# Patient Record
Sex: Female | Born: 1965 | Race: White | Hispanic: No | Marital: Married | Smoking: Current some day smoker
Health system: Southern US, Community
[De-identification: ages and names within clinical notes are randomized; demographics above are authoritative.]

## PROBLEM LIST (undated history)

## (undated) DIAGNOSIS — I82409 Acute embolism and thrombosis of unspecified deep veins of unspecified lower extremity: Secondary | ICD-10-CM

## (undated) DIAGNOSIS — O24419 Gestational diabetes mellitus in pregnancy, unspecified control: Secondary | ICD-10-CM

## (undated) DIAGNOSIS — D649 Anemia, unspecified: Secondary | ICD-10-CM

## (undated) DIAGNOSIS — M719 Bursopathy, unspecified: Secondary | ICD-10-CM

## (undated) HISTORY — DX: Gestational diabetes mellitus in pregnancy, unspecified control: O24.419

## (undated) HISTORY — DX: Bursopathy, unspecified: M71.9

---

## 1999-12-28 ENCOUNTER — Emergency Department (HOSPITAL_COMMUNITY): Admission: EM | Admit: 1999-12-28 | Discharge: 1999-12-28 | Payer: Self-pay | Admitting: Emergency Medicine

## 1999-12-28 ENCOUNTER — Encounter: Payer: Self-pay | Admitting: Emergency Medicine

## 2005-04-30 HISTORY — PX: GASTRIC BYPASS: SHX52

## 2007-08-31 HISTORY — PX: CHOLECYSTECTOMY: SHX55

## 2011-08-31 DIAGNOSIS — I82409 Acute embolism and thrombosis of unspecified deep veins of unspecified lower extremity: Secondary | ICD-10-CM

## 2011-08-31 HISTORY — DX: Acute embolism and thrombosis of unspecified deep veins of unspecified lower extremity: I82.409

## 2012-08-30 HISTORY — PX: BREAST BIOPSY: SHX20

## 2012-10-06 ENCOUNTER — Ambulatory Visit: Payer: Self-pay | Admitting: Unknown Physician Specialty

## 2013-08-30 HISTORY — PX: COLPOSCOPY: SHX161

## 2013-09-17 ENCOUNTER — Ambulatory Visit: Payer: Self-pay | Admitting: General Surgery

## 2013-10-11 ENCOUNTER — Encounter: Payer: Self-pay | Admitting: *Deleted

## 2014-07-16 LAB — HM PAP SMEAR: HM PAP: NEGATIVE

## 2016-11-18 ENCOUNTER — Encounter (HOSPITAL_COMMUNITY): Payer: Self-pay

## 2016-11-18 ENCOUNTER — Emergency Department (HOSPITAL_COMMUNITY): Payer: BLUE CROSS/BLUE SHIELD

## 2016-11-18 ENCOUNTER — Emergency Department (HOSPITAL_COMMUNITY)
Admission: EM | Admit: 2016-11-18 | Discharge: 2016-11-19 | Disposition: A | Discharge status: Home / Self Care | Payer: BLUE CROSS/BLUE SHIELD | Attending: Emergency Medicine | Admitting: Emergency Medicine

## 2016-11-18 DIAGNOSIS — R29810 Facial weakness: Secondary | ICD-10-CM | POA: Diagnosis not present

## 2016-11-18 DIAGNOSIS — R0789 Other chest pain: Secondary | ICD-10-CM

## 2016-11-18 DIAGNOSIS — R42 Dizziness and giddiness: Secondary | ICD-10-CM | POA: Diagnosis not present

## 2016-11-18 DIAGNOSIS — F17210 Nicotine dependence, cigarettes, uncomplicated: Secondary | ICD-10-CM | POA: Insufficient documentation

## 2016-11-18 DIAGNOSIS — Z79899 Other long term (current) drug therapy: Secondary | ICD-10-CM | POA: Diagnosis not present

## 2016-11-18 HISTORY — DX: Anemia, unspecified: D64.9

## 2016-11-18 HISTORY — DX: Acute embolism and thrombosis of unspecified deep veins of unspecified lower extremity: I82.409

## 2016-11-18 LAB — BASIC METABOLIC PANEL
ANION GAP: 8 (ref 5–15)
BUN: 10 mg/dL (ref 6–20)
CHLORIDE: 105 mmol/L (ref 101–111)
CO2: 26 mmol/L (ref 22–32)
Calcium: 8.9 mg/dL (ref 8.9–10.3)
Creatinine, Ser: 0.63 mg/dL (ref 0.44–1.00)
GFR calc Af Amer: >60 mL/min (ref >60)
Glucose, Bld: 124 mg/dL — ABNORMAL HIGH (ref 65–99)
POTASSIUM: 3.9 mmol/L (ref 3.5–5.1)
Sodium: 139 mmol/L (ref 135–145)

## 2016-11-18 LAB — HEPATIC FUNCTION PANEL
ALK PHOS: 52 U/L (ref 38–126)
ALT: 13 U/L — AB (ref 14–54)
AST: 20 U/L (ref 15–41)
Albumin: 3.6 g/dL (ref 3.5–5.0)
Bilirubin, Direct: <0.1 mg/dL — ABNORMAL LOW (ref 0.1–0.5)
TOTAL PROTEIN: 6.6 g/dL (ref 6.5–8.1)
Total Bilirubin: 0.5 mg/dL (ref 0.3–1.2)

## 2016-11-18 LAB — CBC
HEMATOCRIT: 38.9 % (ref 36.0–46.0)
HEMOGLOBIN: 12.3 g/dL (ref 12.0–15.0)
MCH: 26.7 pg (ref 26.0–34.0)
MCHC: 31.6 g/dL (ref 30.0–36.0)
MCV: 84.4 fL (ref 78.0–100.0)
Platelets: 296 10*3/uL (ref 150–400)
RBC: 4.61 MIL/uL (ref 3.87–5.11)
RDW: 15.3 % (ref 11.5–15.5)
WBC: 8.1 10*3/uL (ref 4.0–10.5)

## 2016-11-18 LAB — LIPASE, BLOOD: Lipase: 21 U/L (ref 11–51)

## 2016-11-18 LAB — I-STAT TROPONIN, ED
Troponin i, poc: 0 ng/mL (ref 0.00–0.08)
Troponin i, poc: 0 ng/mL (ref 0.00–0.08)

## 2016-11-18 MED ORDER — METOCLOPRAMIDE HCL 5 MG/ML IJ SOLN
10.0000 mg | Freq: Once | INTRAMUSCULAR | Status: AC
  Administered 2016-11-18: 10 mg via INTRAVENOUS
  Filled 2016-11-18: qty 2

## 2016-11-18 MED ORDER — GADOBENATE DIMEGLUMINE 529 MG/ML IV SOLN
20.0000 mL | Freq: Once | INTRAVENOUS | Status: AC
  Administered 2016-11-18: 18 mL via INTRAVENOUS

## 2016-11-18 MED ORDER — IOPAMIDOL (ISOVUE-370) INJECTION 76%
INTRAVENOUS | Status: AC
  Administered 2016-11-18: 100 mL
  Filled 2016-11-18: qty 100

## 2016-11-18 MED ORDER — KETOROLAC TROMETHAMINE 15 MG/ML IJ SOLN
15.0000 mg | Freq: Once | INTRAMUSCULAR | Status: AC
  Administered 2016-11-18: 15 mg via INTRAVENOUS
  Filled 2016-11-18: qty 1

## 2016-11-18 MED ORDER — LORAZEPAM 2 MG/ML IJ SOLN
1.0000 mg | Freq: Once | INTRAMUSCULAR | Status: AC
  Administered 2016-11-18: 1 mg via INTRAVENOUS
  Filled 2016-11-18: qty 1

## 2016-11-18 MED ORDER — HYDROCODONE-ACETAMINOPHEN 5-325 MG PO TABS: 2.0000 | ORAL_TABLET | Freq: Once | ORAL | Status: DC

## 2016-11-18 MED ORDER — MORPHINE SULFATE (PF) 4 MG/ML IV SOLN
4.0000 mg | Freq: Once | INTRAVENOUS | Status: AC
  Administered 2016-11-18: 4 mg via INTRAVENOUS
  Filled 2016-11-18: qty 1

## 2016-11-18 MED ORDER — DIPHENHYDRAMINE HCL 50 MG/ML IJ SOLN
25.0000 mg | Freq: Once | INTRAMUSCULAR | Status: AC
  Administered 2016-11-18: 25 mg via INTRAVENOUS
  Filled 2016-11-18: qty 1

## 2016-11-18 NOTE — ED Provider Notes (Signed)
[facility]-EMERGENCY DEPT Provider Note   CSN: [id number] Arrival date & time: 11/18/16  1444     History   Chief Complaint Chief Complaint  Patient presents with  . Chest Pain    HPI Kristy Zuniga is a 51 y.o. female.  HPI   51 yo F with h/o anemia, DVT here with general fatigue, subjective shortness of breath, and chest pain. Pt states that she had a mild cold several weeks ago with associated cough and mild shortness of breath. She recovered from this and was doing well until the last two days. Over the Last 2 days, she has developed a sharp, pleuritic right-sided chest pain that radiates to her shoulder. She has had no abdominal pain, nausea, or vomiting and is status post cholecystectomy. She also endorses generalized, severe fatigue along with subjective left facial droop, difficulty walking, and nausea with dizziness and spinning sensation. Her family tried to get her to come to the doctor and were finally able to convince her this afternoon, as her symptoms were not improving. She currently endorses ongoing chest pain as well as dizziness and fatigue. She has no history of stroke. She does have a history of DVT in the past but denies any leg swelling and this was reportedly a provoked DVT due to immobility.  Past Medical History:  Diagnosis Date  . Anemia   . DVT (deep venous thrombosis) (HCC)     There are no active problems to display for this patient.   Past Surgical History:  Procedure Laterality Date  . GASTRIC BYPASS      OB History    No data available       Home Medications    Prior to Admission medications   Medication Sig Start Date End Date Taking? Authorizing Provider  DiphenhydrAMINE HCl, Sleep, (SLEEP AID) 25 MG CAPS Take 1 capsule by mouth at bedtime.    Yes Historical Provider, MD  meclizine (ANTIVERT) 25 MG tablet Take 1 tablet (25 mg total) by mouth every 6 (six) hours as needed for dizziness. 11/19/16   Pricilla Loveless, MD    Family History No  family history on file.  Social History Social History  Substance Use Topics  . Smoking status: Current Some Day Smoker    Types: Cigarettes  . Smokeless tobacco: Never Used  . Alcohol use Yes     Allergies   Patient has no known allergies.   Review of Systems Review of Systems  Constitutional: Positive for fatigue. Negative for chills and fever.  HENT: Negative for congestion and rhinorrhea.   Eyes: Negative for visual disturbance.  Respiratory: Positive for shortness of breath. Negative for cough and wheezing.   Cardiovascular: Negative for chest pain and leg swelling.  Gastrointestinal: Negative for abdominal pain, diarrhea, nausea and vomiting.  Genitourinary: Negative for dysuria and flank pain.  Musculoskeletal: Negative for neck pain and neck stiffness.  Skin: Negative for rash and wound.  Allergic/Immunologic: Negative for immunocompromised state.  Neurological: Positive for dizziness, weakness and light-headedness. Negative for headaches.  All other systems reviewed and are negative.    Physical Exam Updated Vital Signs BP 106/67   Pulse 65   Temp 98.5 F (36.9 C) (Oral)   Resp 16   Ht 5\' 8"  (1.727 m)   Wt 180 lb (81.6 kg)   LMP 10/14/2016   SpO2 100%   BMI 27.37 kg/m   Physical Exam  Constitutional: She is oriented to person, place, and time. She appears well-developed and well-nourished. No  distress.  HENT:  Head: Normocephalic and atraumatic.  Eyes: Conjunctivae are normal.  Neck: Neck supple.  Cardiovascular: Normal rate, regular rhythm and normal heart sounds.  Exam reveals no friction rub.   No murmur heard. Pulmonary/Chest: Effort normal and breath sounds normal. No respiratory distress. She has no wheezes. She has no rales. She exhibits tenderness (Moderate right parasternal and intercostal tenderness).  Abdominal: She exhibits no distension.  Musculoskeletal: She exhibits no edema.  Neurological: She is alert and oriented to person, place,  and time. She exhibits normal muscle tone.  Skin: Skin is warm. Capillary refill takes less than 2 seconds.  Psychiatric: She has a normal mood and affect.  Nursing note and vitals reviewed.   Neurological Exam:  Mental Status: Alert and oriented to person, place, and time. Attention and concentration normal. Speech clear. Recent memory is intact. Cranial Nerves: Visual fields grossly intact. EOMI and PERRLA. No nystagmus noted. Facial sensation intact at forehead, maxillary cheek, and chin/mandible bilaterally. There is subtle left NLF flattening and facial weakness.Marland Kitchen. Hearing grossly normal. Uvula is midline, and palate elevates symmetrically. Normal SCM and trapezius strength. Tongue midline without fasciculations. Motor: Muscle strength 5/5 in proximal and distal UE and LE bilaterally. No pronator drift. Muscle tone normal. Reflexes: 2+ and symmetrical in all four extremities.  Sensation: Intact to light touch in upper and lower extremities distally bilaterally.  Gait: Broad-based, slightly ataxic. Coordination: Past pointing on left FTN, normal on right. Mild dysmetria with HTS on left.   ED Treatments / Results  Labs (all labs ordered are listed, but only abnormal results are displayed) Labs Reviewed  BASIC METABOLIC PANEL - Abnormal; Notable for the following:       Result Value   Glucose, Bld 124 (*)    All other components within normal limits  HEPATIC FUNCTION PANEL - Abnormal; Notable for the following:    ALT 13 (*)    Bilirubin, Direct <0.1 (*)    All other components within normal limits  CBC  LIPASE, BLOOD  I-STAT TROPOININ, ED  I-STAT TROPOININ, ED    EKG  EKG Interpretation  Date/Time:  Thursday November 18 2016 14:51:20 EDT Ventricular Rate:  58 PR Interval:  152 QRS Duration: 92 QT Interval:  456 QTC Calculation: 447 R Axis:   -3 Text Interpretation:  Sinus bradycardia Low voltage QRS Borderline ECG Significant artifact No apparent ST elevations within  limitations of study Confirmed by Delayni Streed MD, Jantz Main [phone]([id number]) on 11/19/2016 1:29:57 PM       Radiology Dg Chest 2 View  Result Date: 11/18/2016 CLINICAL DATA:  Chest pain and shortness of breath EXAM: CHEST  2 VIEW COMPARISON:  None. FINDINGS: The heart size and mediastinal contours are within normal limits. Both lungs are clear. The visualized skeletal structures are unremarkable. Surgical clips in the left upper quadrant. IMPRESSION: No active cardiopulmonary disease. Electronically Signed   By: Jasmine PangKim  Fujinaga M.D.   On: 11/18/2016 15:19   Ct Head Wo Contrast  Result Date: 11/18/2016 CLINICAL DATA:  Facial droop EXAM: CT HEAD WITHOUT CONTRAST TECHNIQUE: Contiguous axial images were obtained from the base of the skull through the vertex without intravenous contrast. COMPARISON:  None. FINDINGS: Brain: The ventricles are normal in size and configuration. There is no intracranial mass, hemorrhage, extra-axial fluid collection, or midline shift. Gray-white compartments are normal. No acute infarct evident. Vascular: No hyperdense vessel. There is slight calcification in each carotid siphon. Skull:  Bony calvarium appears intact. Sinuses/Orbits: Visualized paranasal sinuses are clear. Orbits  appear symmetric bilaterally. Other: Mastoid air cells are clear. IMPRESSION: Slight vascular calcification. No intracranial mass, hemorrhage, or extra-axial fluid collection. Gray-white compartments appear normal. Electronically Signed   By: Bretta Bang III M.D.   On: 11/18/2016 18:46   Ct Angio Chest Pe W And/or Wo Contrast  Result Date: 11/18/2016 CLINICAL DATA:  Acute onset of shortness of breath and centralized chest pain and back pain. Initial encounter. EXAM: CT ANGIOGRAPHY CHEST WITH CONTRAST TECHNIQUE: Multidetector CT imaging of the chest was performed using the standard protocol during bolus administration of intravenous contrast. Multiplanar CT image reconstructions and MIPs were obtained to evaluate  the vascular anatomy. CONTRAST:  100 mL of Isovue 370 IV contrast COMPARISON:  Chest radiograph performed earlier today at 3:05 p.m. FINDINGS: Cardiovascular:  There is no evidence of pulmonary embolus. The heart is borderline normal in size. The thoracic aorta is unremarkable. No calcific atherosclerotic disease is seen. The great vessels are within normal limits. Mediastinum/Nodes: The mediastinum is unremarkable in appearance. No mediastinal lymphadenopathy is seen. No pericardial effusion is identified. The great vessels are grossly unremarkable. Lungs/Pleura: Minimal bilateral atelectasis is noted. The lungs are otherwise clear. No pleural effusion or pneumothorax is seen. No masses are identified. Upper Abdomen: The visualized portions of the liver and spleen are grossly unremarkable. Musculoskeletal: No acute osseous abnormalities are identified. The visualized musculature is unremarkable in appearance. Review of the MIP images confirms the above findings. IMPRESSION: 1. No evidence of pulmonary embolus. 2. Minimal bilateral atelectasis noted.  Lungs otherwise clear. Electronically Signed   By: Roanna Raider M.D.   On: 11/18/2016 18:45   Mr Laqueta Jean And Wo Contrast  Result Date: 11/18/2016 CLINICAL DATA:  Initial evaluation for left facial droop. EXAM: MRI HEAD WITHOUT AND WITH CONTRAST MRI CERVICAL SPINE WITHOUT AND WITH CONTRAST TECHNIQUE: Multiplanar, multiecho pulse sequences of the brain and surrounding structures, and cervical spine, to include the craniocervical junction and cervicothoracic junction, were obtained without and with intravenous contrast. CONTRAST:  18mL MULTIHANCE GADOBENATE DIMEGLUMINE 529 MG/ML IV SOLN COMPARISON:  Comparison with prior CT from earlier the same day. FINDINGS: MRI HEAD FINDINGS Brain: Study somewhat degraded by motion artifact. Cerebral volume within normal limits. No focal parenchymal signal abnormality identified. No significant cerebral white matter disease. No  abnormal foci of restricted diffusion to suggest acute or subacute ischemia. Gray-white matter differentiation maintained. No evidence for chronic infarction. No acute or chronic intracranial hemorrhage. No mass lesion, midline shift or mass effect. Ventricles normal in size without evidence for hydrocephalus. No extra-axial fluid collection. Major dural sinuses are grossly patent. No abnormal enhancement. Pituitary gland within normal limits. Suprasellar region unremarkable. Incidental note made of a small approximately 1 cm pineal cyst, of doubtful significance. Vascular: Major intracranial vascular flow voids maintained. Skull and upper cervical spine: Craniocervical junction within normal limits. Bone marrow signal intensity normal. No scalp soft tissue abnormality. Sinuses/Orbits: Globes and orbital soft tissues within normal limits. Paranasal sinuses are largely clear. No mastoid effusion. Inner ear structures normal. Other: None. MRI CERVICAL SPINE FINDINGS Alignment: Study degraded by motion artifact. Trace anterolisthesis of C3 on C4. Vertebral bodies otherwise normally aligned with preservation of the normal cervical lordosis. Vertebrae: Vertebral body heights maintained. No evidence for acute or chronic fracture. Signal intensity within the vertebral body bone marrow within normal limits. No worrisome osseous lesions. No abnormal marrow edema. No abnormal enhancement. Cord: Signal intensity within the cervical spinal cord is normal. No abnormal enhancement. Posterior Fossa, vertebral arteries, paraspinal tissues: Craniocervical  junction within normal limits. Paraspinous and prevertebral soft tissues within normal limits. Normal intravascular flow voids present within the vertebral arteries bilaterally. No abnormal enhancement. Disc levels: C2-C3: Left central disc osteophyte complex indents the left ventral thecal sac (series 15, image 5). This potentially could affect the left C3 nerve root. No  significant stenosis. Foramina widely patent. C3-C4: Mild diffuse degenerative disc osteophyte. Posterior disc osteophyte mildly flattens the ventral thecal sac without significant stenosis. No foraminal encroachment. C4-C5:  Mild degenerative disc bulge.  No stenosis. C5-C6: Mild degenerative disc bulge with uncovertebral spurring. No stenosis. C6-C7:  Unremarkable. C7-T1:  Unremarkable. Visualized upper thoracic spine unremarkable. IMPRESSION: MRI HEAD IMPRESSION: Normal MRI of the brain.  No acute intracranial process identified. MRI CERVICAL SPINE  IMPRESSION: 1. No acute abnormality within the cervical spine. Normal appearance of the cervical spinal cord. 2. Left posterior disc osteophyte at C2-3, potentially affecting the left C3 nerve root. 3. Additional mild degenerative spondylolysis at C3-4 through C5-6 as above without significant stenosis. Electronically Signed   By: Rise Mu M.D.   On: 11/18/2016 22:19   Mr Cervical Spine W Or Wo Contrast  Result Date: 11/18/2016 CLINICAL DATA:  Initial evaluation for left facial droop. EXAM: MRI HEAD WITHOUT AND WITH CONTRAST MRI CERVICAL SPINE WITHOUT AND WITH CONTRAST TECHNIQUE: Multiplanar, multiecho pulse sequences of the brain and surrounding structures, and cervical spine, to include the craniocervical junction and cervicothoracic junction, were obtained without and with intravenous contrast. CONTRAST:  18mL MULTIHANCE GADOBENATE DIMEGLUMINE 529 MG/ML IV SOLN COMPARISON:  Comparison with prior CT from earlier the same day. FINDINGS: MRI HEAD FINDINGS Brain: Study somewhat degraded by motion artifact. Cerebral volume within normal limits. No focal parenchymal signal abnormality identified. No significant cerebral white matter disease. No abnormal foci of restricted diffusion to suggest acute or subacute ischemia. Gray-white matter differentiation maintained. No evidence for chronic infarction. No acute or chronic intracranial hemorrhage. No mass  lesion, midline shift or mass effect. Ventricles normal in size without evidence for hydrocephalus. No extra-axial fluid collection. Major dural sinuses are grossly patent. No abnormal enhancement. Pituitary gland within normal limits. Suprasellar region unremarkable. Incidental note made of a small approximately 1 cm pineal cyst, of doubtful significance. Vascular: Major intracranial vascular flow voids maintained. Skull and upper cervical spine: Craniocervical junction within normal limits. Bone marrow signal intensity normal. No scalp soft tissue abnormality. Sinuses/Orbits: Globes and orbital soft tissues within normal limits. Paranasal sinuses are largely clear. No mastoid effusion. Inner ear structures normal. Other: None. MRI CERVICAL SPINE FINDINGS Alignment: Study degraded by motion artifact. Trace anterolisthesis of C3 on C4. Vertebral bodies otherwise normally aligned with preservation of the normal cervical lordosis. Vertebrae: Vertebral body heights maintained. No evidence for acute or chronic fracture. Signal intensity within the vertebral body bone marrow within normal limits. No worrisome osseous lesions. No abnormal marrow edema. No abnormal enhancement. Cord: Signal intensity within the cervical spinal cord is normal. No abnormal enhancement. Posterior Fossa, vertebral arteries, paraspinal tissues: Craniocervical junction within normal limits. Paraspinous and prevertebral soft tissues within normal limits. Normal intravascular flow voids present within the vertebral arteries bilaterally. No abnormal enhancement. Disc levels: C2-C3: Left central disc osteophyte complex indents the left ventral thecal sac (series 15, image 5). This potentially could affect the left C3 nerve root. No significant stenosis. Foramina widely patent. C3-C4: Mild diffuse degenerative disc osteophyte. Posterior disc osteophyte mildly flattens the ventral thecal sac without significant stenosis. No foraminal encroachment.  C4-C5:  Mild degenerative disc bulge.  No  stenosis. C5-C6: Mild degenerative disc bulge with uncovertebral spurring. No stenosis. C6-C7:  Unremarkable. C7-T1:  Unremarkable. Visualized upper thoracic spine unremarkable. IMPRESSION: MRI HEAD IMPRESSION: Normal MRI of the brain.  No acute intracranial process identified. MRI CERVICAL SPINE  IMPRESSION: 1. No acute abnormality within the cervical spine. Normal appearance of the cervical spinal cord. 2. Left posterior disc osteophyte at C2-3, potentially affecting the left C3 nerve root. 3. Additional mild degenerative spondylolysis at C3-4 through C5-6 as above without significant stenosis. Electronically Signed   By: Rise Mu M.D.   On: 11/18/2016 22:19    Procedures Procedures (including critical care time)  Medications Ordered in ED Medications  iopamidol (ISOVUE-370) 76 % injection (100 mLs  Contrast Given 11/18/16 1816)  ketorolac (TORADOL) 15 MG/ML injection 15 mg (15 mg Intravenous Given 11/18/16 1925)  morphine 4 MG/ML injection 4 mg (4 mg Intravenous Given 11/18/16 1925)  metoCLOPramide (REGLAN) injection 10 mg (10 mg Intravenous Given 11/18/16 2022)  diphenhydrAMINE (BENADRYL) injection 25 mg (25 mg Intravenous Given 11/18/16 2022)  LORazepam (ATIVAN) injection 1 mg (1 mg Intravenous Given 11/18/16 2027)  gadobenate dimeglumine (MULTIHANCE) injection 20 mL (18 mLs Intravenous Contrast Given 11/18/16 2127)     Initial Impression / Assessment and Plan / ED Course  I have reviewed the triage vital signs and the nursing notes.  Pertinent labs & imaging results that were available during my care of the patient were reviewed by me and considered in my medical decision making (see chart for details).     51 yo F with PMHx as above here with pleuritic right sided CP, also left facial droop (mild), ataxia, and fatigue. Regarding her right sided CP, history and exam is c/w pleurisy. PE is highly concerning given her extensive history and  will obtain CT NGO. Otherwise, EKG is nonischemic and I do not suspect ACS. Regarding her neurological symptoms, will obtain CT scan. Last known normal is unknown. It is possible she may have a postviral cerebellitis or other inflammatory condition. No headache, neck stiffness, leukocytosis, or evidence of meningitis or encephalitis.  CT scan is negative for PE and also troponin is negative. I do not suspect PE or ACS. Will treat as pleurisy with scheduled NSAIDs. Regarding her neurological symptoms. CT head was negative but given persistent symptoms, MRI obtained and shows no evidence of multiple sclerosis or other abnormality. I discussed with Dr. Roseanne Reno of neurology. Given her persistent symptoms, will have him evaluated in the ER with further disposition pending his evaluation.  Patient care transferred to Dr. Criss Alvine at the end of my shift. Patient presentation, ED course, and plan of care discussed with review of all pertinent labs and imaging. Please see his/her note for further details regarding further ED course and disposition.   Final Clinical Impressions(s) / ED Diagnoses   Final diagnoses:  Vertigo  Atypical chest pain     Shaune Pollack, MD 11/19/16 1330

## 2016-11-18 NOTE — ED Triage Notes (Signed)
Pt reports central sharp chest pain that radiates to her right shoulder associated with SOB, nausea and dizziness; onset last week but today she felt worse.

## 2016-11-18 NOTE — ED Notes (Signed)
Pt family came out to speak to this RN after pt was transported to CT and states that pt is drawn up on the left side and has complained to them of L hand numbness. MD aware.

## 2016-11-18 NOTE — ED Notes (Signed)
Pt pistol, holster and clip given to [place] with security who this RN witnessed lock in safe.

## 2016-11-18 NOTE — ED Notes (Signed)
Patient transported to CT 

## 2016-11-18 NOTE — ED Notes (Signed)
ED Provider at bedside. 

## 2016-11-19 DIAGNOSIS — R29810 Facial weakness: Secondary | ICD-10-CM | POA: Diagnosis not present

## 2016-11-19 DIAGNOSIS — R42 Dizziness and giddiness: Secondary | ICD-10-CM | POA: Diagnosis not present

## 2016-11-19 MED ORDER — MECLIZINE HCL 25 MG PO TABS: 25.0000 mg | ORAL_TABLET | Freq: Four times a day (QID) | ORAL | 0 refills | Status: AC | PRN

## 2016-11-19 NOTE — ED Provider Notes (Signed)
12:46 AM Dr. Roseanne RenoStewart has seen and evaluated. Given negative MRI and patient report of dizziness, he feels this is a peripheral vertigo. Given her trouble walking, recommended observation admission to the hospitalist. Recommends Antivert 25 mg every 6 hours as needed. Possible early bell's palsy given slight mouth asymmetry though no droop. Upon discussing this with the patient, she wants to go home. She understands reason for admission is because of her trouble walking and concern for possible fall and subsequent injury. She understands this but also appears to have good family support with multiple members at the bedside who are going to help her at home. She is feeling somewhat better. Her chest pain is atypical with no clear etiology at this time. Plan for close outpatient follow-up with PCP as well as a neurology follow-up. At this time she appears stable for discharge, however I have recommended that she return if any of her symptoms worsen or do not improve. She and family agree.   Pricilla LovelessScott Carnell Casamento, MD 11/19/16 [phone]

## 2016-11-19 NOTE — ED Notes (Signed)
Pt verbalized understanding of discharge instructions.

## 2016-11-19 NOTE — Consult Note (Signed)
Admission H&P    Chief Complaint: Fatigue, chest pain, shortness of breath and vertigo.  HPI: Kristy Zuniga is an 51 y.o. female with a history of DVT and asthma presenting with general fatigue, shortness of breath, chest pain and vertigo. Symptoms started about 2 days ago. Vertigo as pronounced with standing and walking. There is associated nausea as well. CT scan of her head was obtained which was unremarkable. Because of a history of DVT she had a CT of her chest which showed no signs of pulmonary embolus. An MRI of the brain was obtained which showed no acute intracranial abnormality. She was given Zofran for nausea which has helped. Family members have noted right lower facial droop which is new. She has not experienced weakness or numbness involving extremities. Speech has not changed.  Past Medical History:  Diagnosis Date  . Anemia   . DVT (deep venous thrombosis) ([place])     Past Surgical History:  Procedure Laterality Date  . GASTRIC BYPASS      No family history on file. Social History:  reports that she has been smoking Cigarettes.  She has never used smokeless tobacco. She reports that she drinks alcohol. Her drug history is not on file.  Allergies: No Known Allergies  Medications: Preadmission medications were reviewed by me.  ROS: History obtained from the patient  General ROS: negative for - chills, fatigue, fever, night sweats, weight gain or weight loss Psychological ROS: negative for - behavioral disorder, hallucinations, memory difficulties, mood swings or suicidal ideation Ophthalmic ROS: negative for - blurry vision, double vision, eye pain or loss of vision ENT ROS: negative for - epistaxis, nasal discharge, oral lesions, sore throat, tinnitus or vertigo Allergy and Immunology ROS: negative for - hives or itchy/watery eyes Hematological and Lymphatic ROS: negative for - bleeding problems, bruising or swollen lymph nodes Endocrine ROS: negative for -  galactorrhea, hair pattern changes, polydipsia/polyuria or temperature intolerance Respiratory ROS: negative for - cough, hemoptysis, shortness of breath or wheezing Cardiovascular ROS: negative for - chest pain, dyspnea on exertion, edema or irregular heartbeat Gastrointestinal ROS: negative for - abdominal pain, diarrhea, hematemesis, nausea/vomiting or stool incontinence Genito-Urinary ROS: negative for - dysuria, hematuria, incontinence or urinary frequency/urgency Musculoskeletal ROS: negative for - joint swelling or muscular weakness Neurological ROS: as noted in HPI Dermatological ROS: negative for rash and skin lesion changes  Physical Examination: Blood pressure (!) 149/80, pulse (!) 56, temperature 98.5 F (36.9 C), temperature source Oral, resp. rate 16, height '5\' 8"'$  (1.727 m), weight 81.6 kg (180 lb), last menstrual period 10/14/2016, SpO2 99 %.  HEENT-  Normocephalic, no lesions, without obvious abnormality.  Normal external eye and conjunctiva.  Normal TM's bilaterally.  Normal auditory canals and external ears. Normal external nose, mucus membranes and septum.  Normal pharynx. Neck supple with no masses, nodes, nodules or enlargement. Cardiovascular - regular rate and rhythm, S1, S2 normal, no murmur, click, rub or gallop Lungs - chest clear, no wheezing, rales, normal symmetric air entry Abdomen - soft, non-tender; bowel sounds normal; no masses,  no organomegaly Extremities - no joint deformities, effusion, or inflammation  Neurologic Examination: Mental Status: Alert, oriented, thought content appropriate.  Speech fluent without evidence of aphasia. Able to follow commands without difficulty. Cranial Nerves: II-Visual fields were normal. III/IV/VI-Pupils were equal and reacted normally to light. Extraocular movements were full and conjugate.    V/VII-slight reduction and perception of tactile sensation over left side of the face; facial asymmetry noted with mild right  lower facial droop. VIII-normal. X-normal speech and symmetrical palatal movement. XI: trapezius strength/neck flexion strength normal bilaterally Motor: 5/5 bilaterally with normal tone and bulk Sensory: Normal throughout. Deep Tendon Reflexes: 2+ and symmetric. Plantars: Flexor bilaterally Cerebellar: Normal finger-to-nose and heel-to-shin testing bilaterally. Carotid auscultation: Normal  Results for orders placed or performed during the hospital encounter of 11/18/16 (from the past 48 hour(s))  Basic metabolic panel     Status: Abnormal   Collection Time: 11/18/16  2:58 PM  Result Value Ref Range   Sodium 139 135 - 145 mmol/L   Potassium 3.9 3.5 - 5.1 mmol/L   Chloride 105 101 - 111 mmol/L   CO2 26 22 - 32 mmol/L   Glucose, Bld 124 (H) 65 - 99 mg/dL   BUN 10 6 - 20 mg/dL   Creatinine, Ser 0.63 0.44 - 1.00 mg/dL   Calcium 8.9 8.9 - 10.3 mg/dL   GFR calc non Af Amer >60 >60 mL/min   GFR calc Af Amer >60 >60 mL/min    Comment: (NOTE) The eGFR has been calculated using the CKD EPI equation. This calculation has not been validated in all clinical situations. eGFR's persistently <60 mL/min signify possible Chronic Kidney Disease.    Anion gap 8 5 - 15  CBC     Status: None   Collection Time: 11/18/16  2:58 PM  Result Value Ref Range   WBC 8.1 4.0 - 10.5 K/uL   RBC 4.61 3.87 - 5.11 MIL/uL   Hemoglobin 12.3 12.0 - 15.0 g/dL   HCT 38.9 36.0 - 46.0 %   MCV 84.4 78.0 - 100.0 fL   MCH 26.7 26.0 - 34.0 pg   MCHC 31.6 30.0 - 36.0 g/dL   RDW 15.3 11.5 - 15.5 %   Platelets 296 150 - 400 K/uL  Hepatic function panel     Status: Abnormal   Collection Time: 11/18/16  2:58 PM  Result Value Ref Range   Total Protein 6.6 6.5 - 8.1 g/dL   Albumin 3.6 3.5 - 5.0 g/dL   AST 20 15 - 41 U/L   ALT 13 (L) 14 - 54 U/L   Alkaline Phosphatase 52 38 - 126 U/L   Total Bilirubin 0.5 0.3 - 1.2 mg/dL   Bilirubin, Direct <0.1 (L) 0.1 - 0.5 mg/dL   Indirect Bilirubin NOT CALCULATED 0.3 - 0.9 mg/dL   Lipase, blood     Status: None   Collection Time: 11/18/16  2:58 PM  Result Value Ref Range   Lipase 21 11 - 51 U/L  I-stat troponin, ED     Status: None   Collection Time: 11/18/16  3:13 PM  Result Value Ref Range   Troponin i, poc 0.00 0.00 - 0.08 ng/mL   Comment 3            Comment: Due to the release kinetics of cTnI, a negative result within the first hours of the onset of symptoms does not rule out myocardial infarction with certainty. If myocardial infarction is still suspected, repeat the test at appropriate intervals.   I-stat troponin, ED     Status: None   Collection Time: 11/18/16  7:40 PM  Result Value Ref Range   Troponin i, poc 0.00 0.00 - 0.08 ng/mL   Comment 3            Comment: Due to the release kinetics of cTnI, a negative result within the first hours of the onset of symptoms does not rule out myocardial infarction with  certainty. If myocardial infarction is still suspected, repeat the test at appropriate intervals.    Dg Chest 2 View  Result Date: 11/18/2016 CLINICAL DATA:  Chest pain and shortness of breath EXAM: CHEST  2 VIEW COMPARISON:  None. FINDINGS: The heart size and mediastinal contours are within normal limits. Both lungs are clear. The visualized skeletal structures are unremarkable. Surgical clips in the left upper quadrant. IMPRESSION: No active cardiopulmonary disease. Electronically Signed   By: Donavan Foil M.D.   On: 11/18/2016 15:19   Ct Head Wo Contrast  Result Date: 11/18/2016 CLINICAL DATA:  Facial droop EXAM: CT HEAD WITHOUT CONTRAST TECHNIQUE: Contiguous axial images were obtained from the base of the skull through the vertex without intravenous contrast. COMPARISON:  None. FINDINGS: Brain: The ventricles are normal in size and configuration. There is no intracranial mass, hemorrhage, extra-axial fluid collection, or midline shift. Gray-white compartments are normal. No acute infarct evident. Vascular: No hyperdense vessel. There is  slight calcification in each carotid siphon. Skull:  Bony calvarium appears intact. Sinuses/Orbits: Visualized paranasal sinuses are clear. Orbits appear symmetric bilaterally. Other: Mastoid air cells are clear. IMPRESSION: Slight vascular calcification. No intracranial mass, hemorrhage, or extra-axial fluid collection. Gray-white compartments appear normal. Electronically Signed   By: Lowella Grip III M.D.   On: 11/18/2016 18:46   Ct Angio Chest Pe W And/or Wo Contrast  Result Date: 11/18/2016 CLINICAL DATA:  Acute onset of shortness of breath and centralized chest pain and back pain. Initial encounter. EXAM: CT ANGIOGRAPHY CHEST WITH CONTRAST TECHNIQUE: Multidetector CT imaging of the chest was performed using the standard protocol during bolus administration of intravenous contrast. Multiplanar CT image reconstructions and MIPs were obtained to evaluate the vascular anatomy. CONTRAST:  100 mL of Isovue 370 IV contrast COMPARISON:  Chest radiograph performed earlier today at 3:05 p.m. FINDINGS: Cardiovascular:  There is no evidence of pulmonary embolus. The heart is borderline normal in size. The thoracic aorta is unremarkable. No calcific atherosclerotic disease is seen. The great vessels are within normal limits. Mediastinum/Nodes: The mediastinum is unremarkable in appearance. No mediastinal lymphadenopathy is seen. No pericardial effusion is identified. The great vessels are grossly unremarkable. Lungs/Pleura: Minimal bilateral atelectasis is noted. The lungs are otherwise clear. No pleural effusion or pneumothorax is seen. No masses are identified. Upper Abdomen: The visualized portions of the liver and spleen are grossly unremarkable. Musculoskeletal: No acute osseous abnormalities are identified. The visualized musculature is unremarkable in appearance. Review of the MIP images confirms the above findings. IMPRESSION: 1. No evidence of pulmonary embolus. 2. Minimal bilateral atelectasis noted.   Lungs otherwise clear. Electronically Signed   By: Garald Balding M.D.   On: 11/18/2016 18:45   Mr Jeri Cos And Wo Contrast  Result Date: 11/18/2016 CLINICAL DATA:  Initial evaluation for left facial droop. EXAM: MRI HEAD WITHOUT AND WITH CONTRAST MRI CERVICAL SPINE WITHOUT AND WITH CONTRAST TECHNIQUE: Multiplanar, multiecho pulse sequences of the brain and surrounding structures, and cervical spine, to include the craniocervical junction and cervicothoracic junction, were obtained without and with intravenous contrast. CONTRAST:  85m MULTIHANCE GADOBENATE DIMEGLUMINE 529 MG/ML IV SOLN COMPARISON:  Comparison with prior CT from earlier the same day. FINDINGS: MRI HEAD FINDINGS Brain: Study somewhat degraded by motion artifact. Cerebral volume within normal limits. No focal parenchymal signal abnormality identified. No significant cerebral white matter disease. No abnormal foci of restricted diffusion to suggest acute or subacute ischemia. Gray-white matter differentiation maintained. No evidence for chronic infarction. No acute or chronic intracranial  hemorrhage. No mass lesion, midline shift or mass effect. Ventricles normal in size without evidence for hydrocephalus. No extra-axial fluid collection. Major dural sinuses are grossly patent. No abnormal enhancement. Pituitary gland within normal limits. Suprasellar region unremarkable. Incidental note made of a small approximately 1 cm pineal cyst, of doubtful significance. Vascular: Major intracranial vascular flow voids maintained. Skull and upper cervical spine: Craniocervical junction within normal limits. Bone marrow signal intensity normal. No scalp soft tissue abnormality. Sinuses/Orbits: Globes and orbital soft tissues within normal limits. Paranasal sinuses are largely clear. No mastoid effusion. Inner ear structures normal. Other: None. MRI CERVICAL SPINE FINDINGS Alignment: Study degraded by motion artifact. Trace anterolisthesis of C3 on C4. Vertebral  bodies otherwise normally aligned with preservation of the normal cervical lordosis. Vertebrae: Vertebral body heights maintained. No evidence for acute or chronic fracture. Signal intensity within the vertebral body bone marrow within normal limits. No worrisome osseous lesions. No abnormal marrow edema. No abnormal enhancement. Cord: Signal intensity within the cervical spinal cord is normal. No abnormal enhancement. Posterior Fossa, vertebral arteries, paraspinal tissues: Craniocervical junction within normal limits. Paraspinous and prevertebral soft tissues within normal limits. Normal intravascular flow voids present within the vertebral arteries bilaterally. No abnormal enhancement. Disc levels: C2-C3: Left central disc osteophyte complex indents the left ventral thecal sac (series 15, image 5). This potentially could affect the left C3 nerve root. No significant stenosis. Foramina widely patent. C3-C4: Mild diffuse degenerative disc osteophyte. Posterior disc osteophyte mildly flattens the ventral thecal sac without significant stenosis. No foraminal encroachment. C4-C5:  Mild degenerative disc bulge.  No stenosis. C5-C6: Mild degenerative disc bulge with uncovertebral spurring. No stenosis. C6-C7:  Unremarkable. C7-T1:  Unremarkable. Visualized upper thoracic spine unremarkable. IMPRESSION: MRI HEAD IMPRESSION: Normal MRI of the brain.  No acute intracranial process identified. MRI CERVICAL SPINE  IMPRESSION: 1. No acute abnormality within the cervical spine. Normal appearance of the cervical spinal cord. 2. Left posterior disc osteophyte at C2-3, potentially affecting the left C3 nerve root. 3. Additional mild degenerative spondylolysis at C3-4 through C5-6 as above without significant stenosis. Electronically Signed   By: Rise Mu M.D.   On: 11/18/2016 22:19   Mr Cervical Spine W Or Wo Contrast  Result Date: 11/18/2016 CLINICAL DATA:  Initial evaluation for left facial droop. EXAM: MRI HEAD  WITHOUT AND WITH CONTRAST MRI CERVICAL SPINE WITHOUT AND WITH CONTRAST TECHNIQUE: Multiplanar, multiecho pulse sequences of the brain and surrounding structures, and cervical spine, to include the craniocervical junction and cervicothoracic junction, were obtained without and with intravenous contrast. CONTRAST:  57mL MULTIHANCE GADOBENATE DIMEGLUMINE 529 MG/ML IV SOLN COMPARISON:  Comparison with prior CT from earlier the same day. FINDINGS: MRI HEAD FINDINGS Brain: Study somewhat degraded by motion artifact. Cerebral volume within normal limits. No focal parenchymal signal abnormality identified. No significant cerebral white matter disease. No abnormal foci of restricted diffusion to suggest acute or subacute ischemia. Gray-white matter differentiation maintained. No evidence for chronic infarction. No acute or chronic intracranial hemorrhage. No mass lesion, midline shift or mass effect. Ventricles normal in size without evidence for hydrocephalus. No extra-axial fluid collection. Major dural sinuses are grossly patent. No abnormal enhancement. Pituitary gland within normal limits. Suprasellar region unremarkable. Incidental note made of a small approximately 1 cm pineal cyst, of doubtful significance. Vascular: Major intracranial vascular flow voids maintained. Skull and upper cervical spine: Craniocervical junction within normal limits. Bone marrow signal intensity normal. No scalp soft tissue abnormality. Sinuses/Orbits: Globes and orbital soft tissues within normal limits.  Paranasal sinuses are largely clear. No mastoid effusion. Inner ear structures normal. Other: None. MRI CERVICAL SPINE FINDINGS Alignment: Study degraded by motion artifact. Trace anterolisthesis of C3 on C4. Vertebral bodies otherwise normally aligned with preservation of the normal cervical lordosis. Vertebrae: Vertebral body heights maintained. No evidence for acute or chronic fracture. Signal intensity within the vertebral body bone  marrow within normal limits. No worrisome osseous lesions. No abnormal marrow edema. No abnormal enhancement. Cord: Signal intensity within the cervical spinal cord is normal. No abnormal enhancement. Posterior Fossa, vertebral arteries, paraspinal tissues: Craniocervical junction within normal limits. Paraspinous and prevertebral soft tissues within normal limits. Normal intravascular flow voids present within the vertebral arteries bilaterally. No abnormal enhancement. Disc levels: C2-C3: Left central disc osteophyte complex indents the left ventral thecal sac (series 15, image 5). This potentially could affect the left C3 nerve root. No significant stenosis. Foramina widely patent. C3-C4: Mild diffuse degenerative disc osteophyte. Posterior disc osteophyte mildly flattens the ventral thecal sac without significant stenosis. No foraminal encroachment. C4-C5:  Mild degenerative disc bulge.  No stenosis. C5-C6: Mild degenerative disc bulge with uncovertebral spurring. No stenosis. C6-C7:  Unremarkable. C7-T1:  Unremarkable. Visualized upper thoracic spine unremarkable. IMPRESSION: MRI HEAD IMPRESSION: Normal MRI of the brain.  No acute intracranial process identified. MRI CERVICAL SPINE  IMPRESSION: 1. No acute abnormality within the cervical spine. Normal appearance of the cervical spinal cord. 2. Left posterior disc osteophyte at C2-3, potentially affecting the left C3 nerve root. 3. Additional mild degenerative spondylolysis at C3-4 through C5-6 as above without significant stenosis. Electronically Signed   By: Jeannine Boga M.D.   On: 11/18/2016 22:19    Assessment/Plan 51 year old lady presenting with multiple complaints including vertigo, description of which is indicative of probable benign paroxysmal positional vertigo. Examination shows no focal deficits other than mild facial droop on the right. Early right Bell's palsy cannot be ruled out. No sign of an acute intracranial abnormality was seen  on CT scan and MRI clearly no mass lesion nor stroke or demyelinating type lesion.  Recommendations: 1. Trial of meclizine 25 mg every 6 hours when necessary vertigo 2. Diazepam 1 mg every 6 hours if vertigo is not controlled with meclizine 3. Defer intervention with steroids for possible Bell's palsy, as diagnosis is uncertain at this point. Steroid treatment is recommended however if she develops further right facial weakness, or loss of taste on the right side of her tongue. 4. Physical therapy consult for vestibular training  C.R. Nicole Kindred, MD [facility] [phone]  11/19/2016, 12:38 AM

## 2017-02-21 ENCOUNTER — Encounter: Payer: Self-pay | Admitting: Obstetrics and Gynecology

## 2017-02-22 ENCOUNTER — Encounter: Payer: Self-pay | Admitting: Obstetrics and Gynecology

## 2017-02-23 NOTE — Progress Notes (Signed)
This encounter was created in error - please disregard.

## 2018-10-21 IMAGING — CT CT HEAD W/O CM
4 series · 23 of 47 positions shown, 27 images · non-contrast
Comparison: None.

CLINICAL DATA: Facial droop

EXAM:
CT HEAD WITHOUT CONTRAST
TECHNIQUE: Contiguous axial images were obtained from the base of the skull
through the vertex without intravenous contrast.

[Series 201: head w/o, idose (1) · axial · non-contrast · 0.44mm/px · z∈[+986,+1116]mm · 13 of 32 slices shown, 17 images]
[im 3/32  brain]
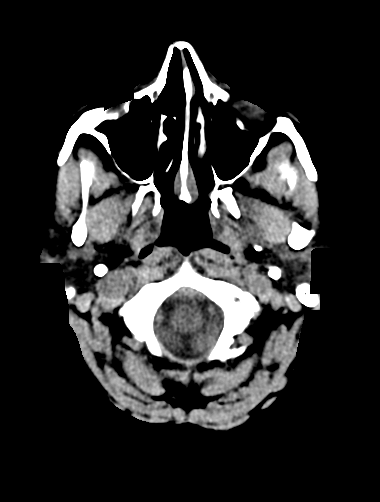
[im 3/32  bone]
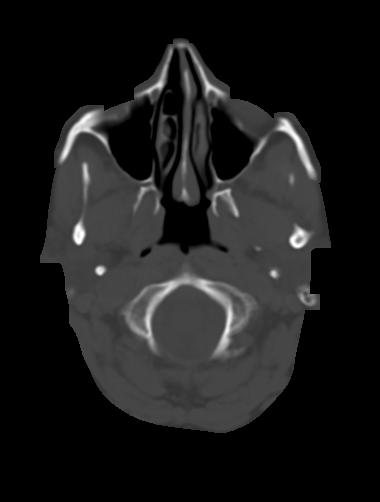
[im 5/32  brain]
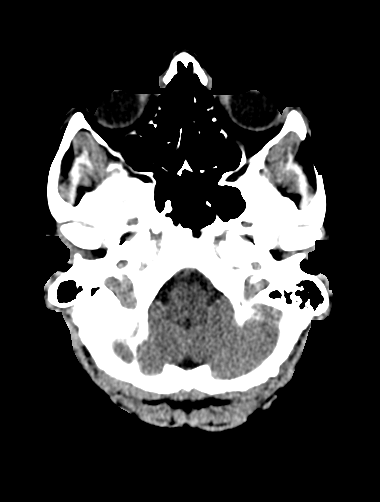
[im 7/32  brain]
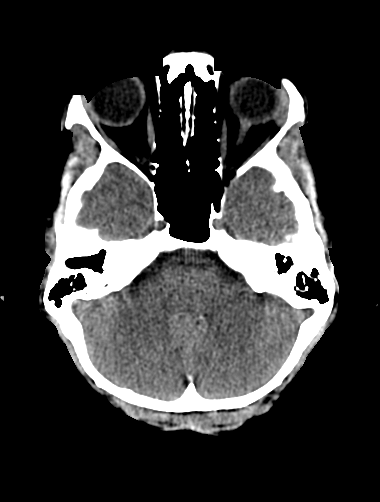
[im 9/32  brain]
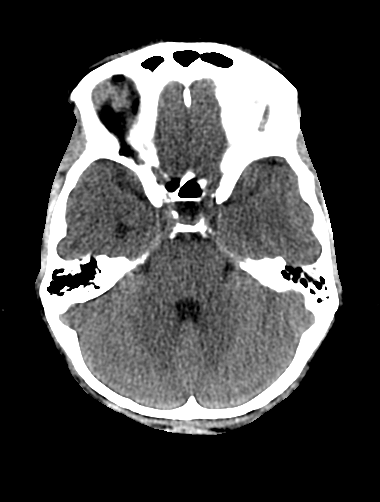
[im 12/32  brain]
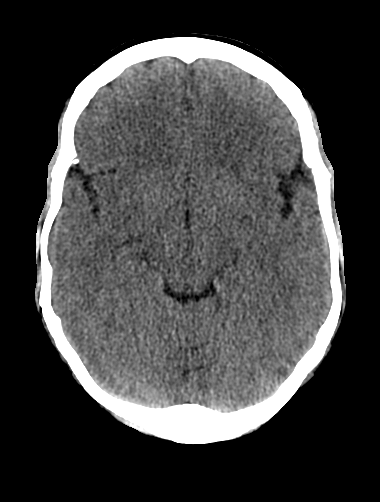
[im 12/32  bone]
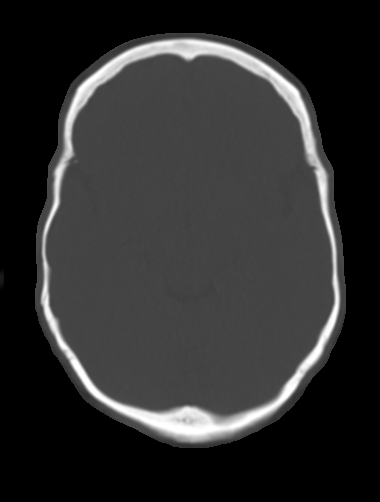
[im 14/32  brain]
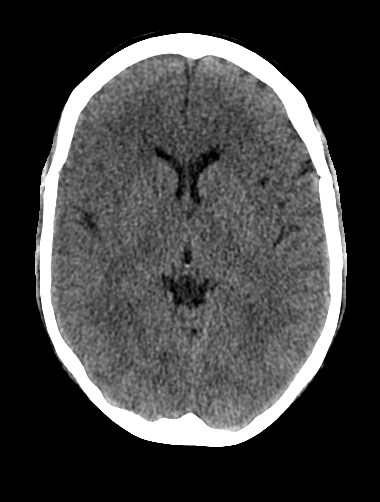
[im 16/32  brain]
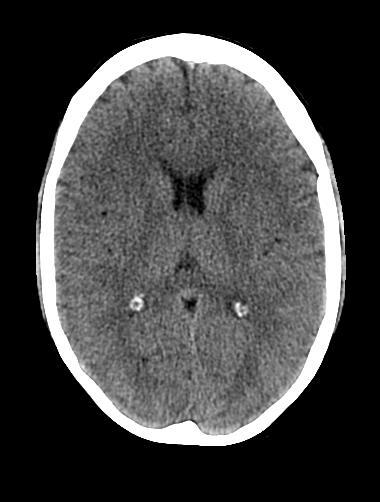
[im 18/32  brain]
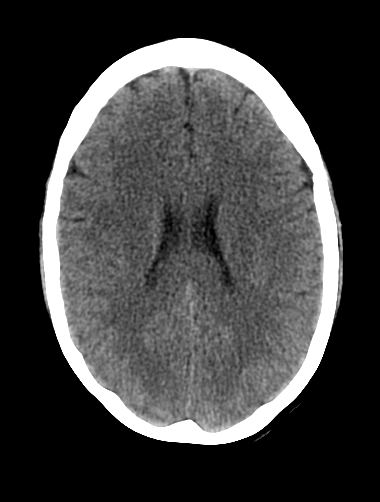
[im 20/32  brain]
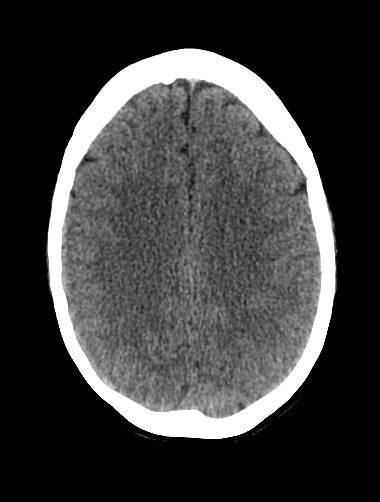
[im 20/32  bone]
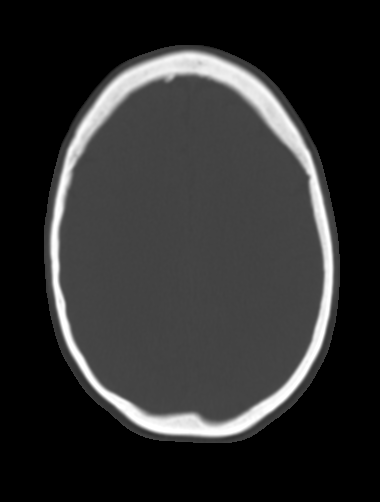
[im 23/32  brain]
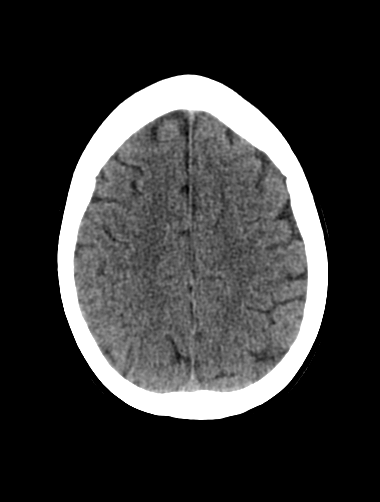
[im 25/32  brain]
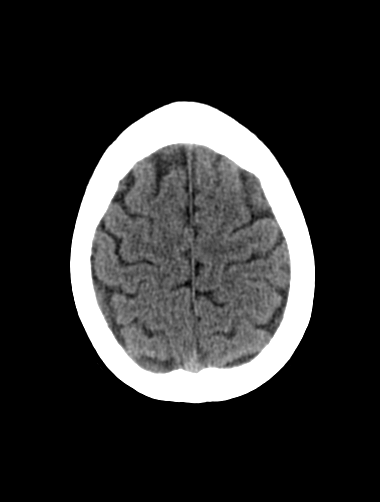
[im 27/32  brain]
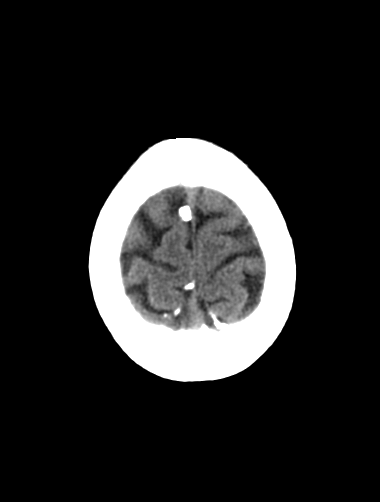
[im 29/32  brain]
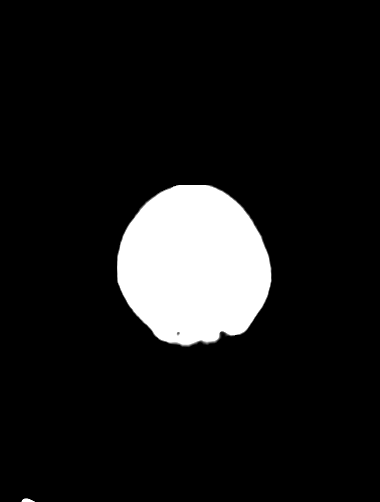
[im 29/32  bone]
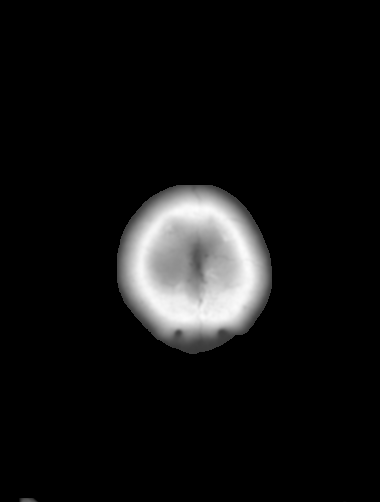

[Series 202: head w/o bone, idose (1) · axial · non-contrast · 0.44mm/px · z∈[+986,+1041]mm · 4 of 32 slices shown]
[im 3/32  bone]
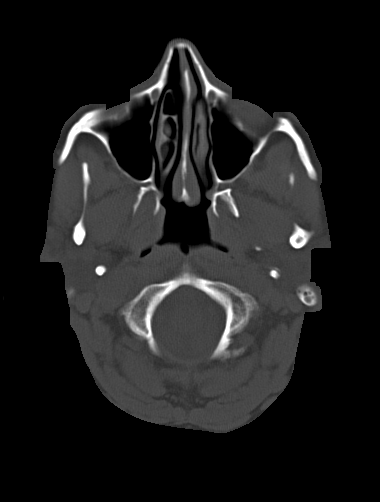
[im 7/32  bone]
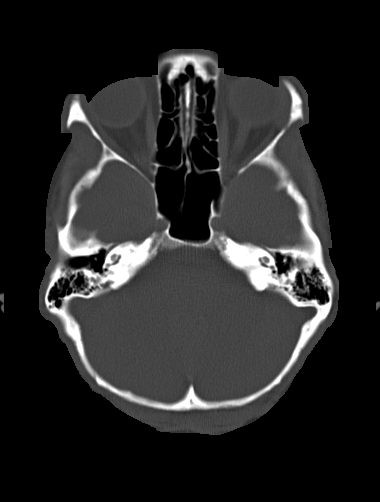
[im 12/32  bone]
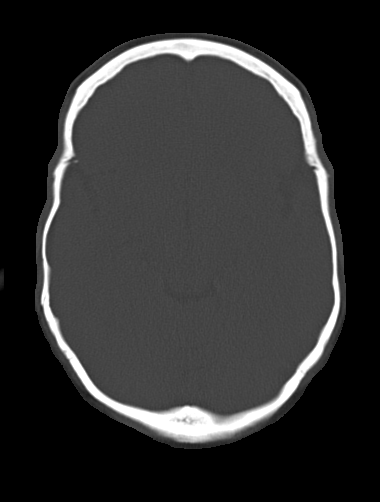
[im 14/32  bone]
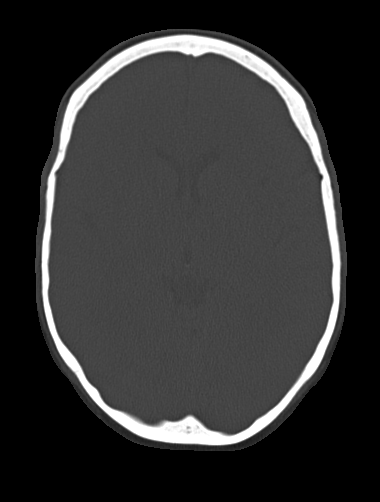

[Series 203: coronal st, idose (1) · coronal · 0.40mm/px · 3 of 69 slices shown]
[im 23/69  brain]
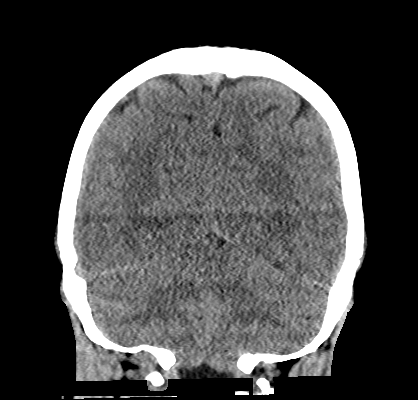
[im 31/69  brain]
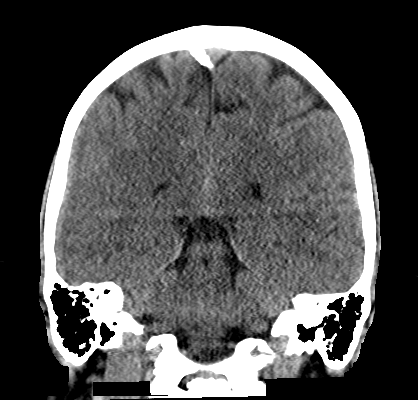
[im 38/69  brain]
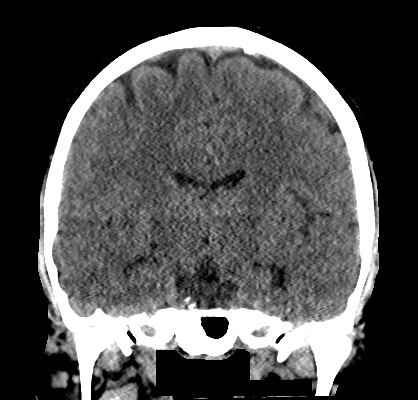

[Series 204: sagittal st, idose (1) · sagittal · 0.40mm/px · 3 of 55 slices shown]
[im 19/55  brain]
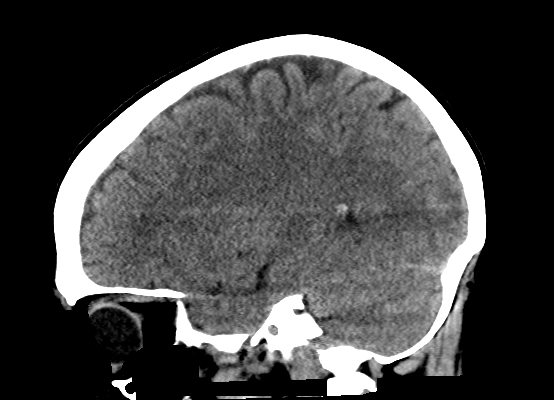
[im 28/55  brain]
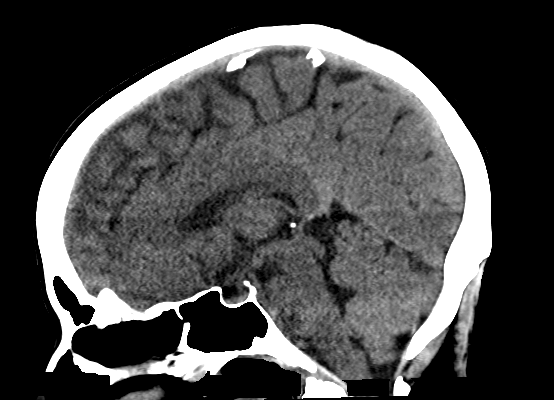
[im 37/55  brain]
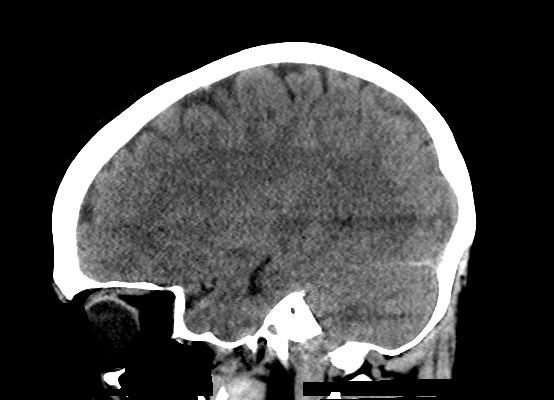

[23 of 47 positions shown; findings below may reference images not displayed]

FINDINGS: Brain: The ventricles are normal in size and configuration. There is
no intracranial mass, hemorrhage, extra-axial fluid collection, or
midline shift. Gray-white compartments are normal. No acute infarct
evident.

Vascular: No hyperdense vessel. There is slight calcification in
each carotid siphon.

Skull:  Bony calvarium appears intact.

Sinuses/Orbits: Visualized paranasal sinuses are clear. Orbits
appear symmetric bilaterally.

Other: Mastoid air cells are clear.
IMPRESSION: Slight vascular calcification. No intracranial mass, hemorrhage, or
extra-axial fluid collection. Gray-white compartments appear normal.

## 2021-10-14 ENCOUNTER — Ambulatory Visit: Payer: Self-pay

## 2021-10-14 ENCOUNTER — Ambulatory Visit: Payer: BC Managed Care – PPO | Admitting: Orthopaedic Surgery

## 2021-10-14 ENCOUNTER — Other Ambulatory Visit: Payer: Self-pay

## 2021-10-14 VITALS — Ht 66.75 in | Wt 174.0 lb

## 2021-10-14 DIAGNOSIS — M25552 Pain in left hip: Secondary | ICD-10-CM | POA: Diagnosis not present

## 2021-10-14 DIAGNOSIS — Z96642 Presence of left artificial hip joint: Secondary | ICD-10-CM

## 2021-10-14 DIAGNOSIS — M25551 Pain in right hip: Secondary | ICD-10-CM

## 2021-10-14 DIAGNOSIS — Z96641 Presence of right artificial hip joint: Secondary | ICD-10-CM

## 2021-10-14 NOTE — Progress Notes (Signed)
Office Visit Note   Patient: Kristy Zuniga           Date of Birth: May 19, 1966           MRN: [id number] Visit Date: 10/14/2021              Requested by: No referring provider defined for this encounter. PCP: Patient, No Pcp Per (Inactive)   Assessment & Plan: Visit Diagnoses:  1. Pain in left hip   2. 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  She definitely needs a MRI of both hips to assess these tendons and the proximal IT band to look for the source of her pain which is becoming debilitating for her and definitely affecting her work and quality of life.  Given the fact that she has failed numerous steroid injections, home exercise instruction program, activity modification, anti-inflammatories, several steroid injections and oral steroids, I believe the studies are medically warranted at this standpoint.  I will also send her to outpatient physical therapy for dry needling and other modalities to see if this will help with her pain.  We will see her back in follow-up once we have these MRIs.  Follow-Up Instructions: No follow-ups on file.   Orders:  Orders Placed This Encounter  Procedures   XR HIPS BILAT W OR W/O PELVIS 2V   No orders of the defined types were placed in this encounter.     Procedures: No procedures performed   Clinical Data: No additional findings.   Subjective: Chief Complaint  Patient presents with   Left Hip - Pain   Right Hip - Pain  The patient is a very pleasant 56 year old female who does work in [employer] which does involve significant physical activities.  She has been dealing with bilateral hip pain for over 6 months now and for at least a year.  She has had steroid injections of the trochanteric area of both hips that have helped for only a short period of time for even less  than a few days.  She has been on oral steroids and anti-inflammatories as well and those have not helped.  Her primary care physician did give her instructions for home exercise and stretching routine for her hips and that has not helped.  She points to the trochanteric area of both hips and IT band proximally as a source of her pain.  She denies any radicular components of her pain.  She denies any groin pain bilaterally.  There is been no specific injuries.  This is detrimentally affecting the her mobility, her quality of life and actives daily living and is getting worse for her.  HPI  Review of Systems There is currently listed no headache, chest pain, shortness of breath, fever, chills, nausea, vomiting.  Objective: Vital Signs: Ht 5' 6.75" (1.695 m)    Wt 174 lb (78.9 kg)    BMI 27.46 kg/m   Physical Exam The patient is alert and oriented x3 and in no acute distress Ortho Exam Examination of both hips show the move smoothly and fluidly but she has a lot of pain to palpation over the trochanteric area and proximal IT band of both hips which is quite severe.  There is slight weakness with abduction of both hips. Specialty Comments:  No specialty comments available.  Imaging: XR HIPS BILAT W OR W/O PELVIS 2V  Result Date: 10/14/2021 An AP pelvis and lateral both hips shows no acute findings.  The hip joint space on both hips is very well-maintained.  There is no significant cortical irregularities around the trochanteric area of either proximal femur where the patient describes pain.    PMFS History: There are no problems to display for this patient.  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

## 2021-10-26 ENCOUNTER — Other Ambulatory Visit: Payer: BC Managed Care – PPO
